# Patient Record
Sex: Female | Born: 1965 | Race: White | Hispanic: No | Marital: Married | Smoking: Never smoker
Health system: Southern US, Academic
[De-identification: ages and names within clinical notes are randomized; demographics above are authoritative.]

## PROBLEM LIST (undated history)

## (undated) DIAGNOSIS — M154 Erosive (osteo)arthritis: Secondary | ICD-10-CM

## (undated) DIAGNOSIS — F32A Depression, unspecified: Secondary | ICD-10-CM

## (undated) DIAGNOSIS — I10 Essential (primary) hypertension: Secondary | ICD-10-CM

## (undated) DIAGNOSIS — K219 Gastro-esophageal reflux disease without esophagitis: Secondary | ICD-10-CM

## (undated) DIAGNOSIS — J449 Chronic obstructive pulmonary disease, unspecified: Secondary | ICD-10-CM

## (undated) DIAGNOSIS — E039 Hypothyroidism, unspecified: Secondary | ICD-10-CM

## (undated) HISTORY — DX: Essential (primary) hypertension: I10

## (undated) HISTORY — DX: Chronic obstructive pulmonary disease, unspecified (CMS HCC): J44.9

## (undated) HISTORY — DX: Depression, unspecified: F32.A

## (undated) HISTORY — DX: Hypothyroidism, unspecified: E03.9

## (undated) HISTORY — PX: HX HYSTERECTOMY: SHX81

## (undated) HISTORY — PX: HX APPENDECTOMY: SHX54

## (undated) HISTORY — DX: Erosive (osteo)arthritis: M15.4

## (undated) HISTORY — DX: Gastro-esophageal reflux disease without esophagitis: K21.9

---

## 2019-12-08 IMAGING — MR MRI THORACIC SPINE WITHOUT CONTRAST
4 of 5 series · 26 of 48 positions shown · IV contrast (gadolinium)
Comparison: None.

﻿EXAM:  MRI THORACIC SPINE WITHOUT CONTRAST
INDICATION: Chronic mid back pain.
TECHNIQUE: Multiplanar, multisequential MRI of the thoracic spine was performed without gadolinium contrast.

[Series 5: T2 · sagittal · 4.0mm · 0.78mm/px · 11 of 13 slices shown (1 of 2)]
[im 1/13]
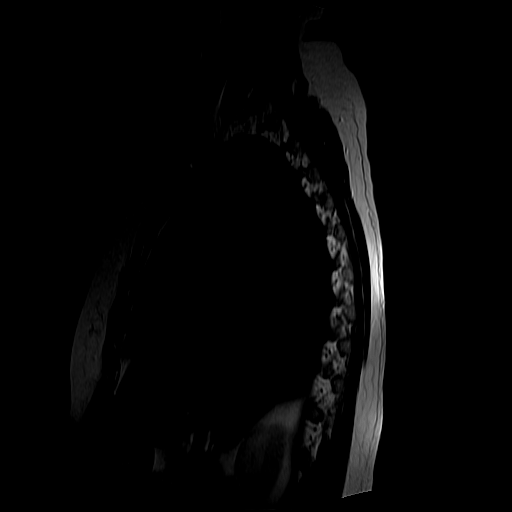
[im 2/13]
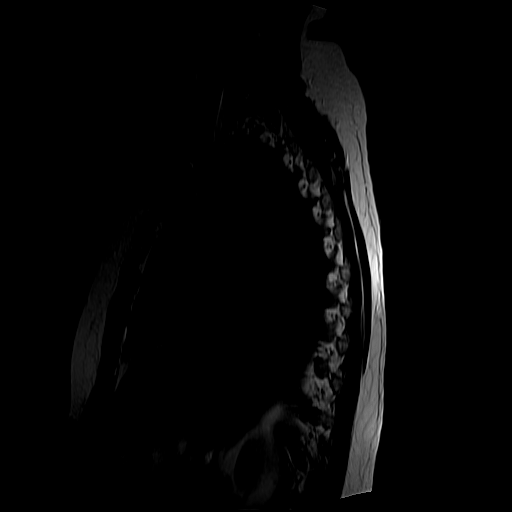
[im 3/13]
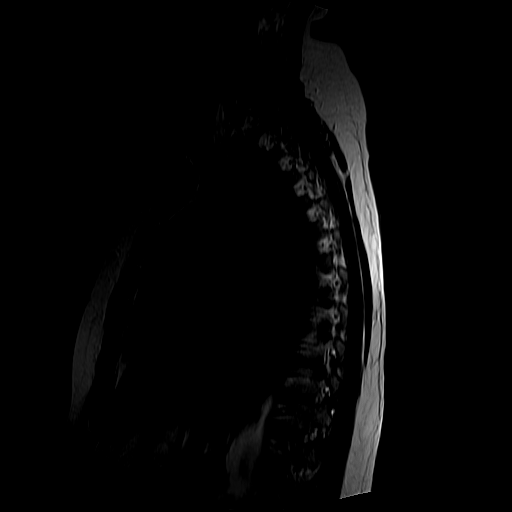
[im 4/13]
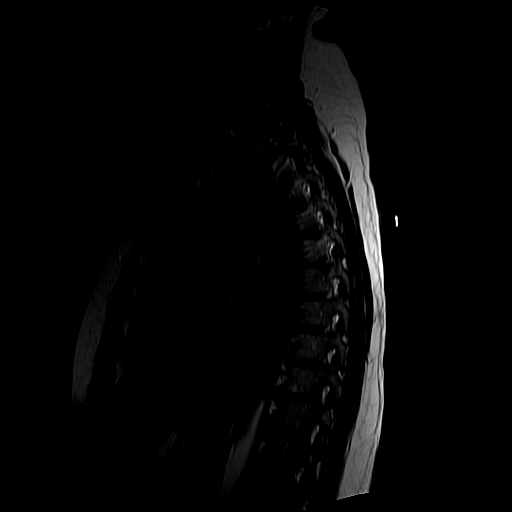
[im 5/13]
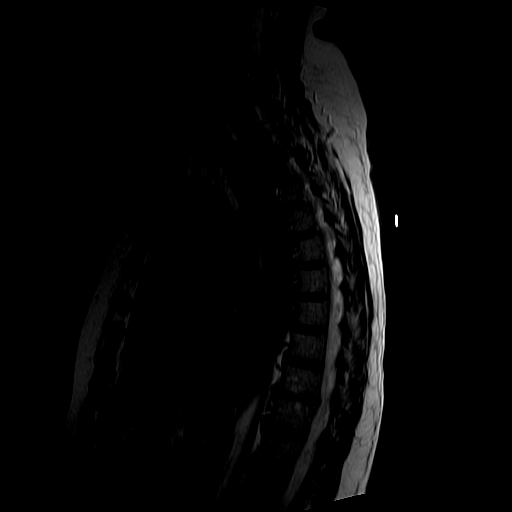
[im 7/13]
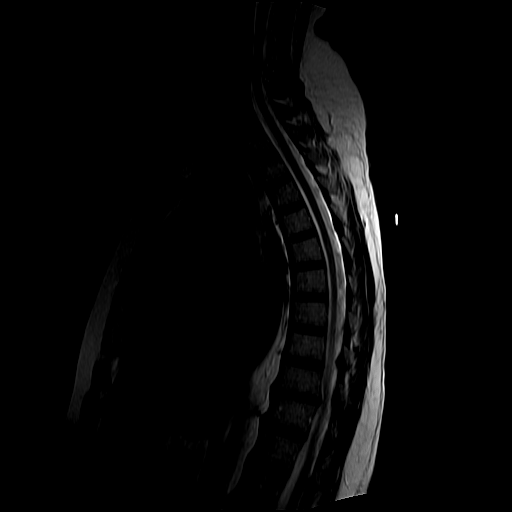
[im 8/13]
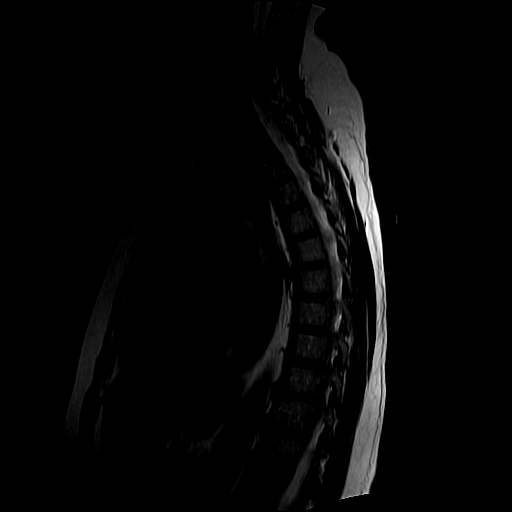
[im 9/13]
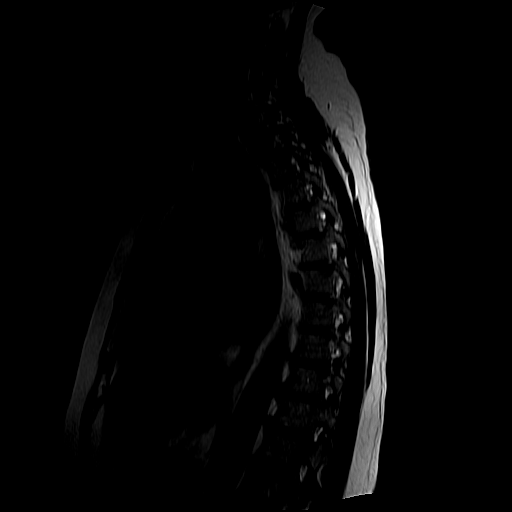
[im 10/13]
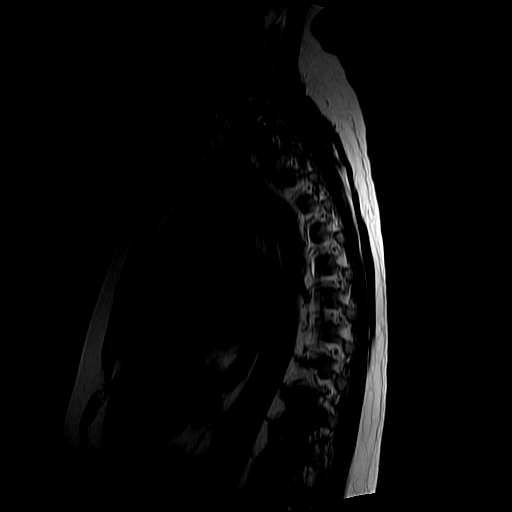
[im 11/13]
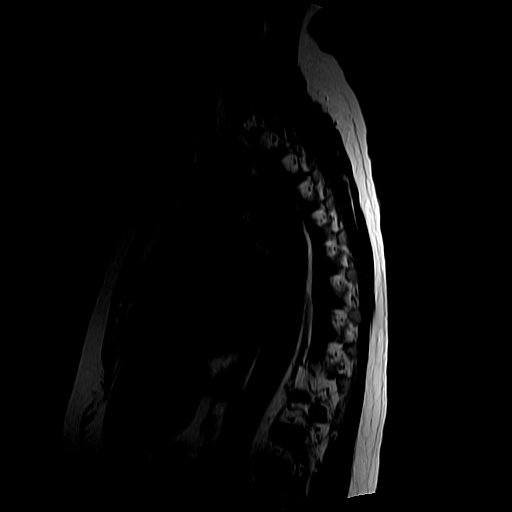
[im 13/13]
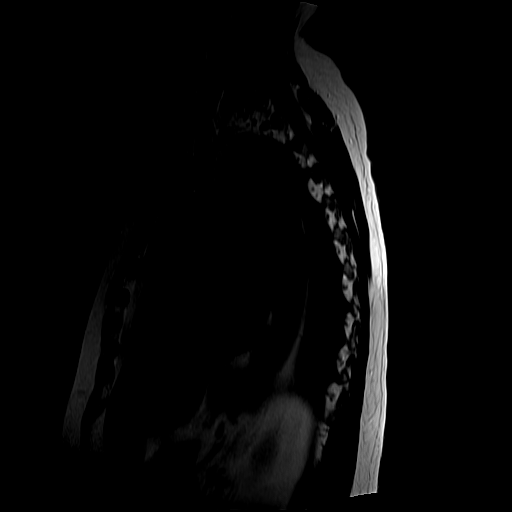

[Series 6: T1 · sagittal · 4.0mm · 0.78mm/px · 3 of 13 slices shown (1 of 2)]
[im 2/13]
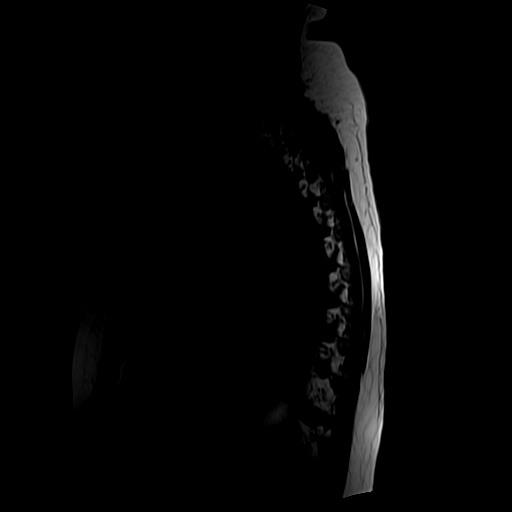
[im 7/13]
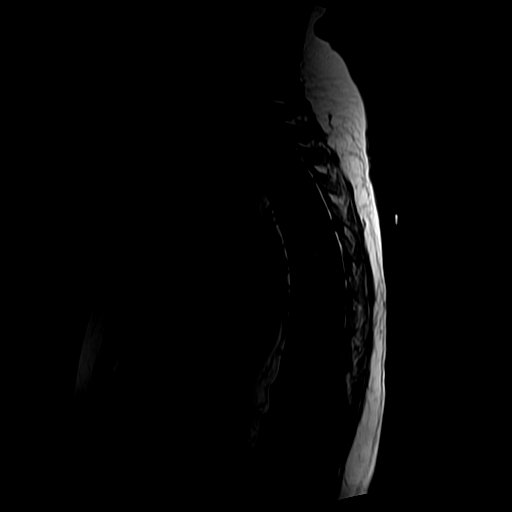
[im 11/13]
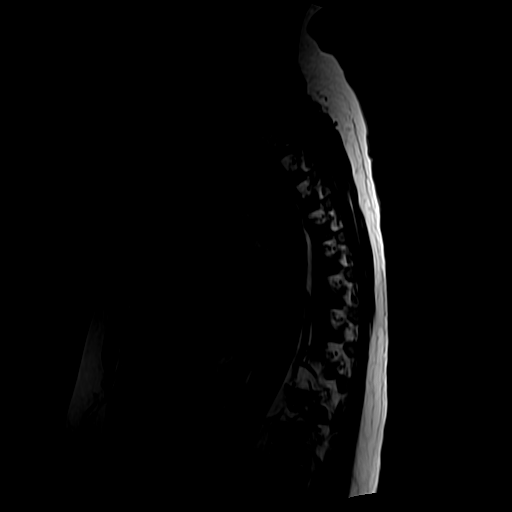

[Series 9: T2 · axial · 4.0mm · 0.62mm/px · z∈[-320,-107]mm · 9 of 12 slices shown (2 of 2)]
[im 1/12]
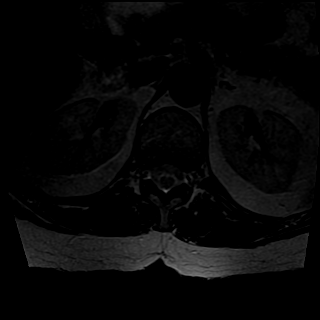
[im 2/12]
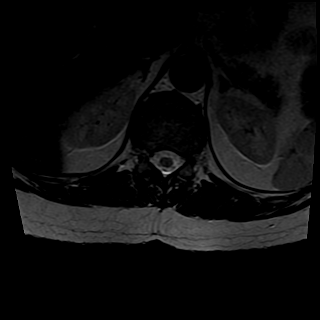
[im 3/12]
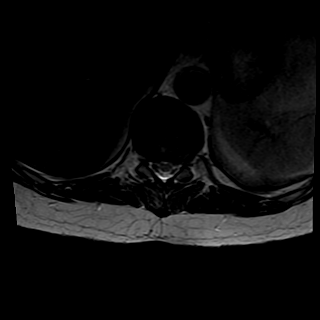
[im 5/12]
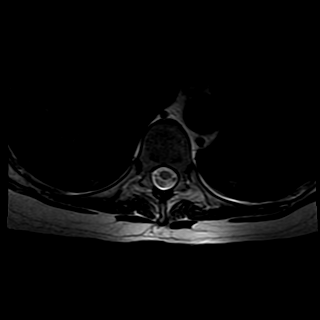
[im 6/12]
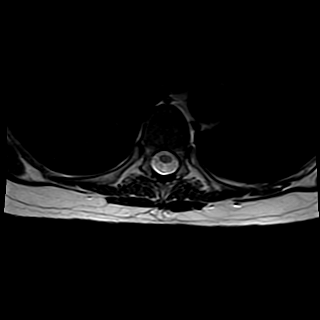
[im 7/12]
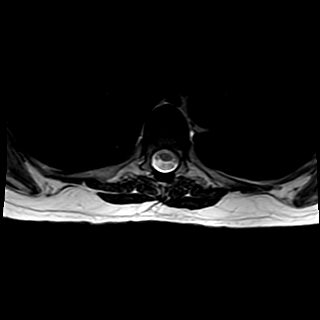
[im 9/12]
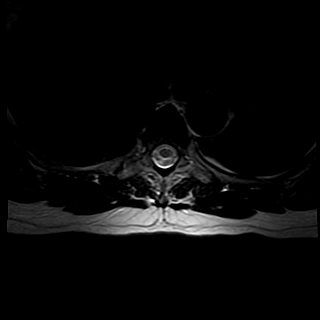
[im 10/12]
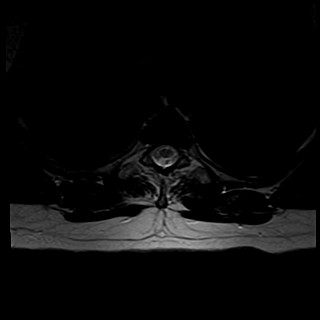
[im 12/12]
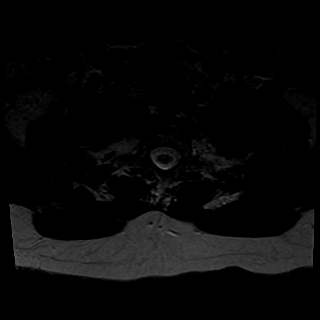

[Series 10: T1 · axial · 4.0mm · 0.62mm/px · z∈[-280,-140]mm · 3 of 12 slices shown (2 of 2)]
[im 2/12]
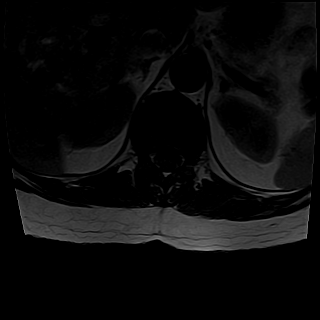
[im 6/12]
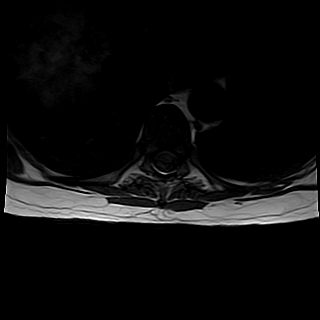
[im 10/12]
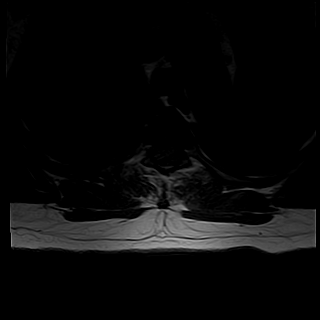

[26 of 48 positions shown; findings below may reference images not displayed]

FINDINGS: Vertebral bodies are normal in height, alignment and signal intensity.  There is no acute fracture or subluxation.  Visualized spinal cord is normal in signal intensity without evidence of compression at any level.  

There is a small broad-based central disc bulge at T10-T11, minimally indenting the ventral cord.  No significant disc herniation, spinal canal or neural foraminal stenosis is seen at any level.  

Paraspinal soft tissues are unremarkable.  There is no pleural effusion.
IMPRESSION: Degenerative changes at T10-T11 level as detailed above, otherwise unremarkable exam.

## 2019-12-08 IMAGING — MR MRI BRAIN WITHOUT CONTRAST
9 of 12 series · 31 of 48 positions shown · IV contrast (gadolinium)
Comparison: None.

﻿EXAM:  MRI BRAIN WITHOUT CONTRAST
INDICATION: Suspected demyelinating disease.
TECHNIQUE: Multiplanar, multisequential MRI of the brain was performed without gadolinium contrast.

[Series 5: DWI · axial · 5.0mm · 1.35mm/px · z∈[-44,+82]mm · 8 of 88 slices shown (1 of 3)]
[im 1/88]
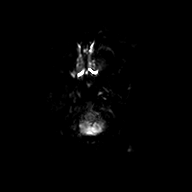
[im 10/88]
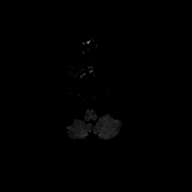
[im 30/88]
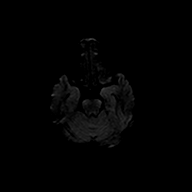
[im 39/88]
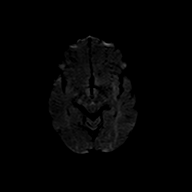
[im 49/88]
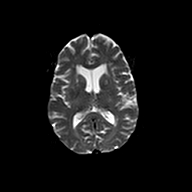
[im 59/88]
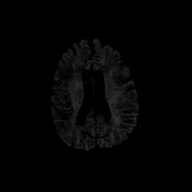
[im 78/88]
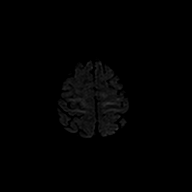
[im 88/88]
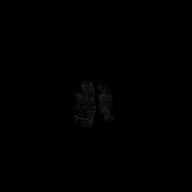

[Series 6: DWI · axial · 5.0mm · 1.35mm/px · z∈[-44,+82]mm · 2 of 22 slices shown (2 of 3)]
[im 1/22]
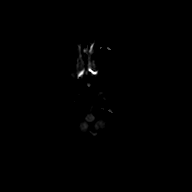
[im 22/22]
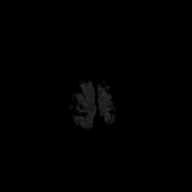

[Series 7: DWI · axial · 5.0mm · 1.35mm/px · z∈[-44,+82]mm · 2 of 20 slices shown (3 of 3)]
[im 1/20]
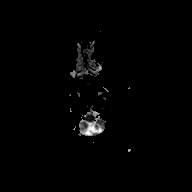
[im 20/20]
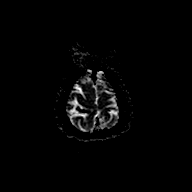

[Series 8: FLAIR · sagittal · 5.0mm · 0.75mm/px · 3 of 28 slices shown (1 of 2)]
[im 1/28]
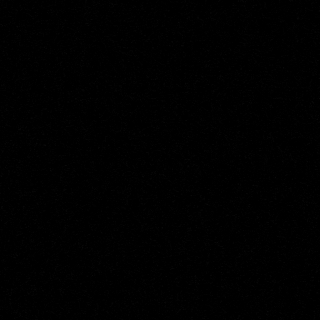
[im 14/28]
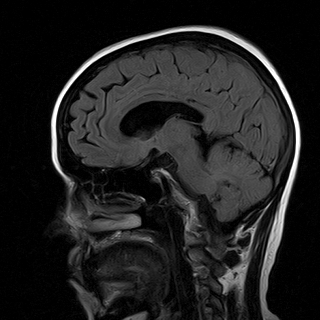
[im 28/28]
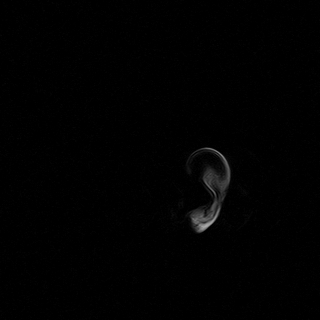

[Series 9: FLAIR · axial · 4.0mm · 0.43mm/px · z∈[-56,+102]mm · 4 of 36 slices shown (2 of 2)]
[im 1/36]
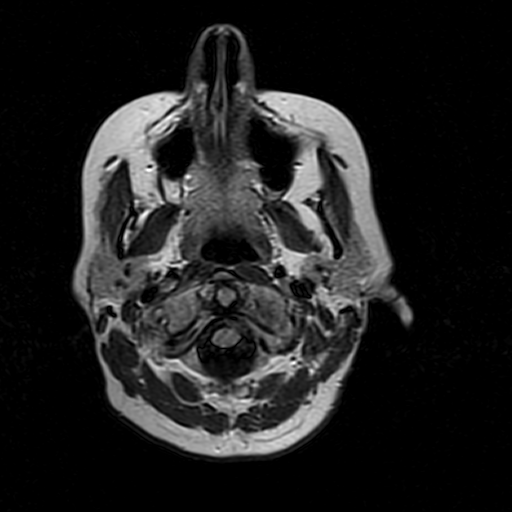
[im 12/36]
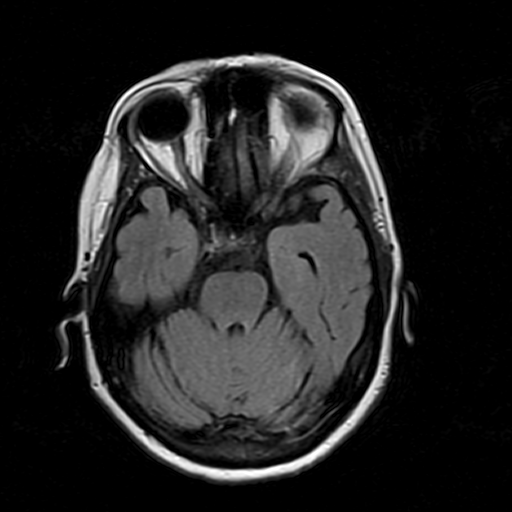
[im 24/36]
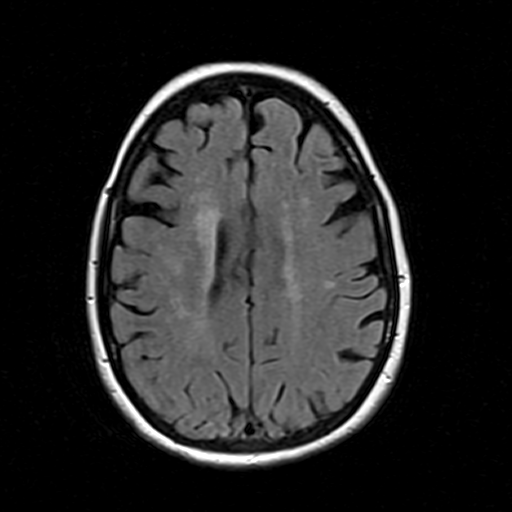
[im 36/36]
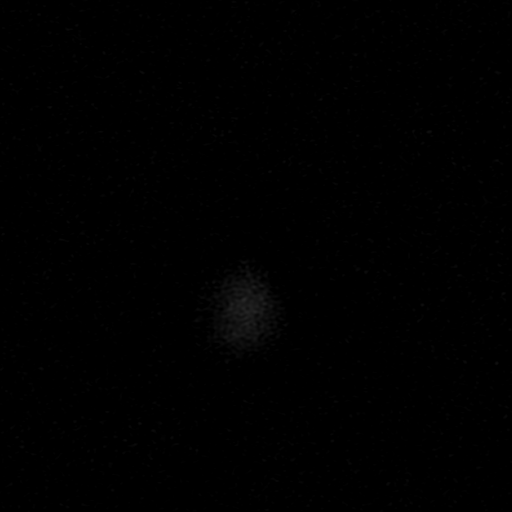

[Series 10: PD · axial · 4.0mm · 0.43mm/px · z∈[-56,+102]mm · 4 of 36 slices shown (1 of 2)]
[im 1/36]
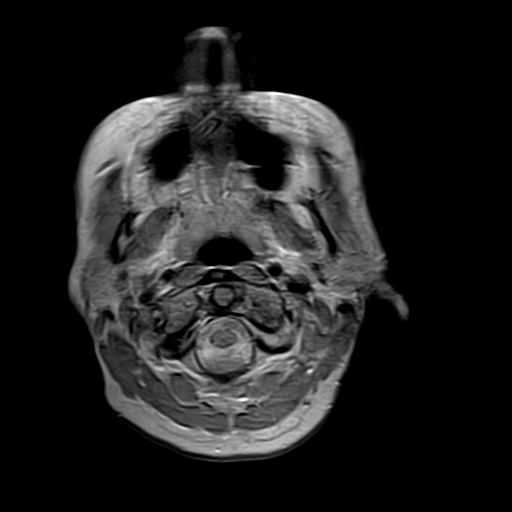
[im 12/36]
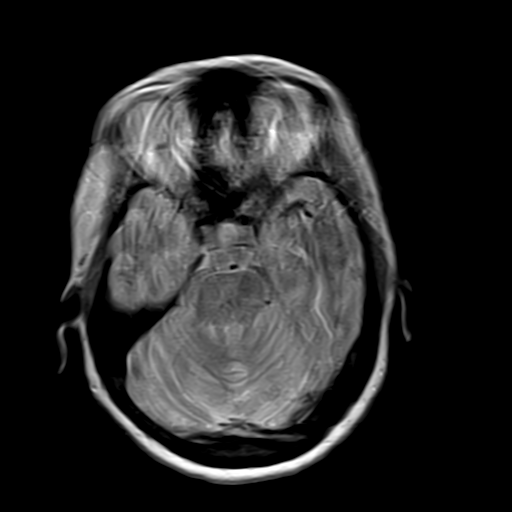
[im 24/36]
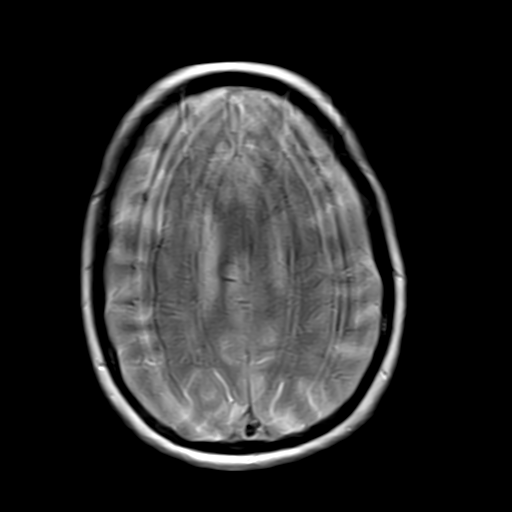
[im 36/36]
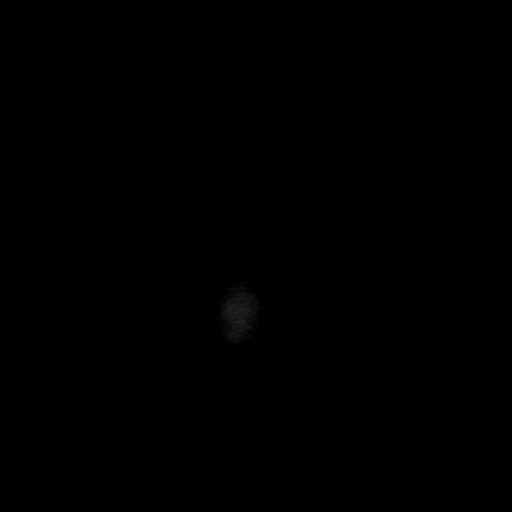

[Series 11: PD · axial · 4.0mm · 0.43mm/px · 1 of 36 slices shown (2 of 2)]
[im 1/36]
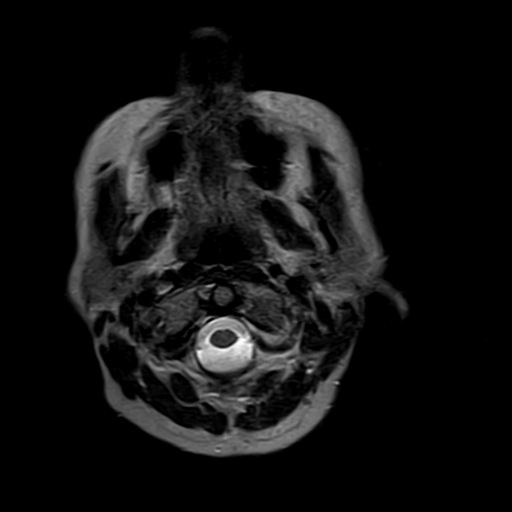

[Series 14: T1 · axial · 4.0mm · 0.43mm/px · z∈[-56,+102]mm · 4 of 36 slices shown]
[im 1/36]
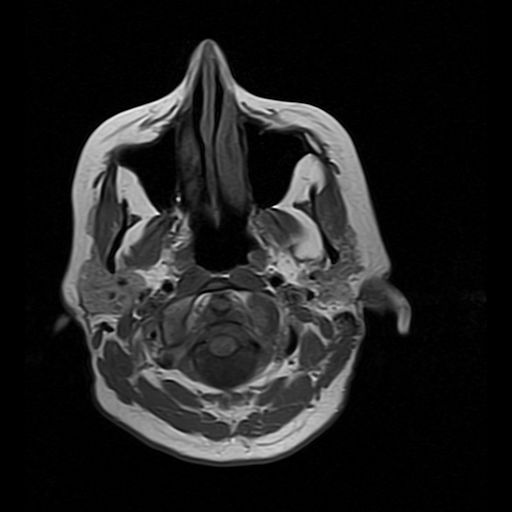
[im 12/36]
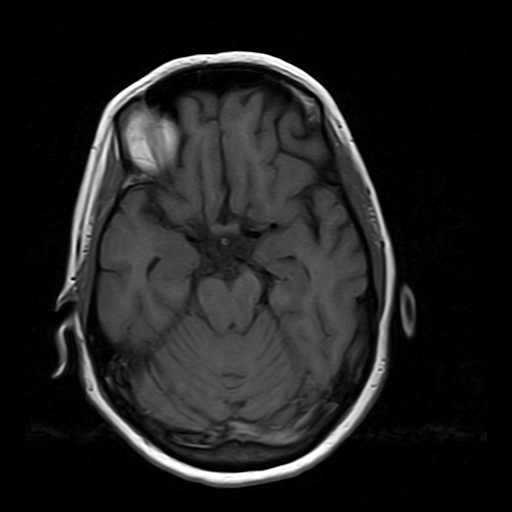
[im 24/36]
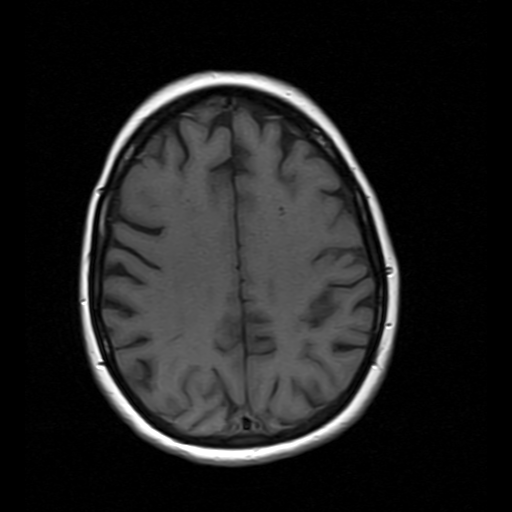
[im 36/36]
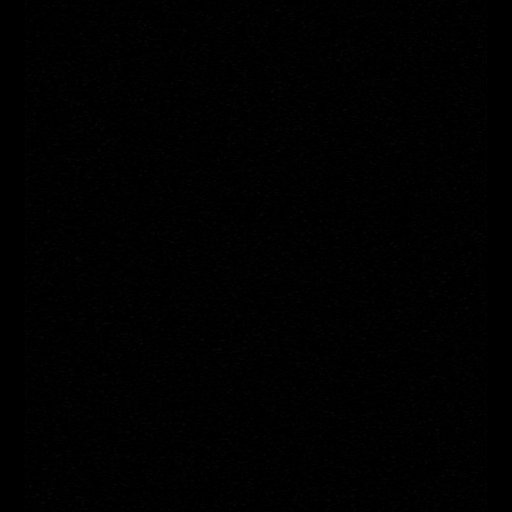

[Series 15: T2 · coronal · 5.0mm · 0.43mm/px · 3 of 28 slices shown]
[im 1/28]
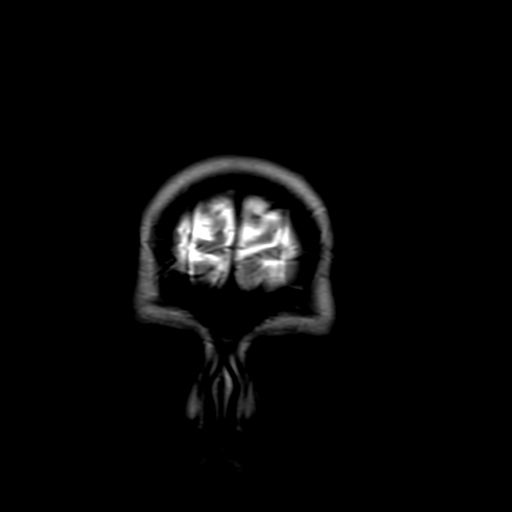
[im 14/28]
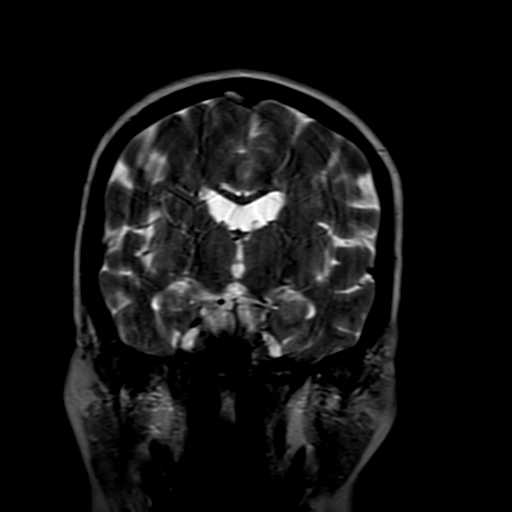
[im 28/28]
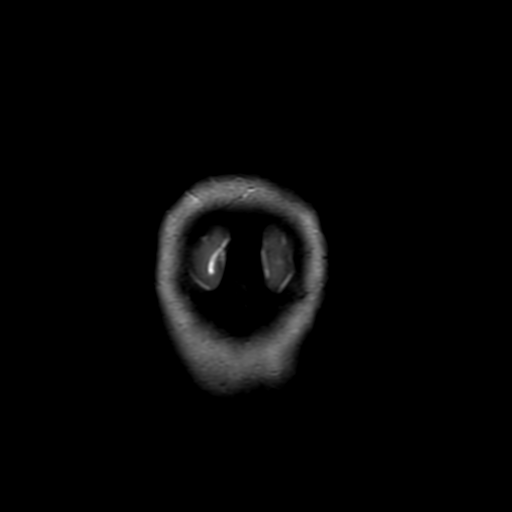

[31 of 48 positions shown; findings below may reference images not displayed]

FINDINGS: Examination is degraded due to excessive patient motion.  Ventricular and sulcal size is normal for the patient's age.  There are predominantly confluent foci of abnormal T2/FLAIR signal hyperintensity within the periventricular deep white matter of each cerebral hemisphere. There is no mass effect, midline shift or intracranial hemorrhage. There is no evidence of acute infarction or prior microhemorrhages. Skull base flow voids and basal cisterns are patent. Sagittal survey of midline structures is unremarkable. There are no extra-axial fluid collections. There is mucoperiosteal thickening within the dependent right maxillary sinus.  Mastoid air cells and orbital contents are unremarkable.
IMPRESSION: Confluent foci of abnormal T2/FLAIR signal hyperintensity within the periventricular deep white matter of each cerebral hemisphere.  Differential considerations would include demyelinating disease versus chronic ischemic changes.  Correlation with CSF analysis is recommended.

## 2021-03-05 IMAGING — MR MRI BRAIN WITHOUT AND WITH CONTRAST
11 of 16 series · 32 of 48 positions shown · IV contrast (Gadavist)
Comparison: Brain MRI dated 12/08/2019.

﻿EXAM:  MRI BRAIN WITHOUT AND WITH CONTRAST
INDICATION: History of multiple sclerosis, follow-up exam.
TECHNIQUE: Multiplanar multisequential MRI of the brain was performed without and with 5 mL of Gadavist.

[Series 5: DWI · axial · 5.0mm · 1.35mm/px · z∈[-41,+85]mm · 7 of 88 slices shown (1 of 3)]
[im 1/88]
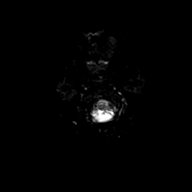
[im 15/88]
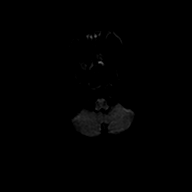
[im 30/88]
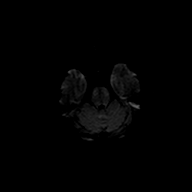
[im 44/88]
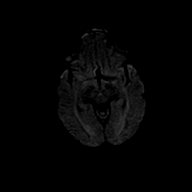
[im 59/88]
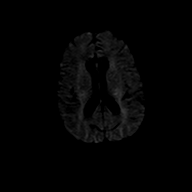
[im 73/88]
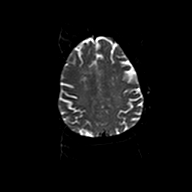
[im 88/88]
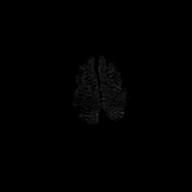

[Series 6: DWI · axial · 5.0mm · 1.35mm/px · z∈[-41,+85]mm · 2 of 22 slices shown (2 of 3)]
[im 1/22]
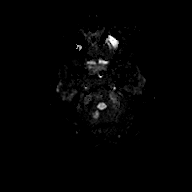
[im 22/22]
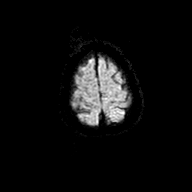

[Series 7: DWI · axial · 5.0mm · 1.35mm/px · z∈[-41,+85]mm · 2 of 22 slices shown (3 of 3)]
[im 1/22]
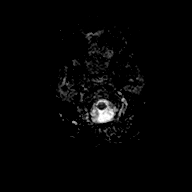
[im 22/22]
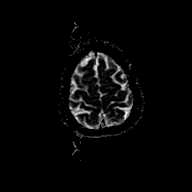

[Series 8: FLAIR · sagittal · 5.0mm · 0.75mm/px · 3 of 32 slices shown (1 of 2)]
[im 1/32]
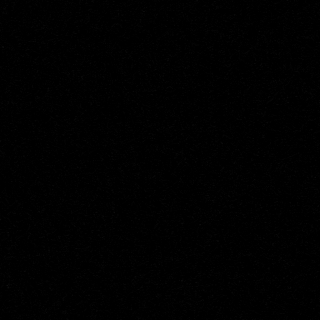
[im 16/32]
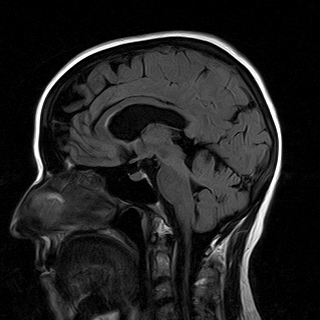
[im 32/32]
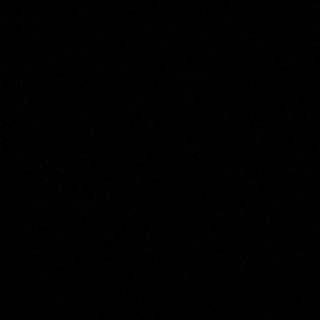

[Series 12: T1 · axial · 4.0mm · 0.43mm/px · z∈[-47,+107]mm · 3 of 36 slices shown]
[im 1/36]
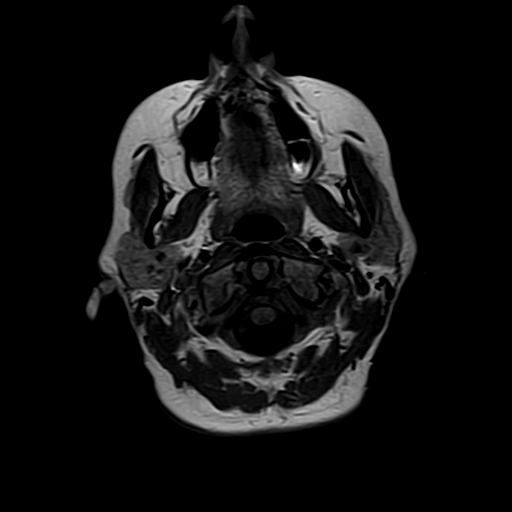
[im 18/36]
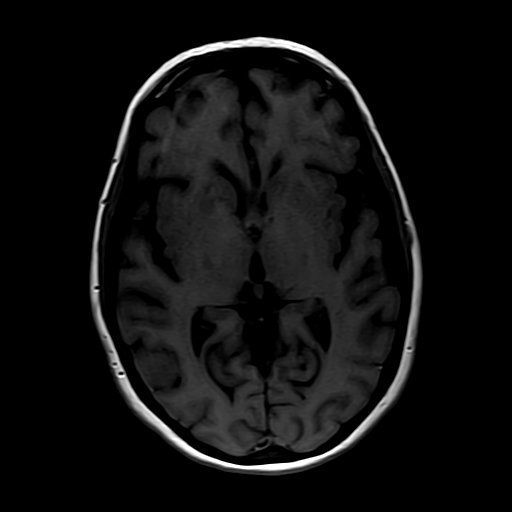
[im 36/36]
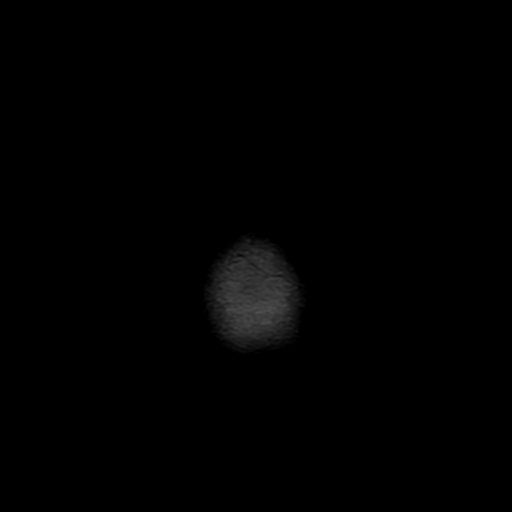

[Series 13: T2 · coronal · 5.0mm · 0.43mm/px · 2 of 28 slices shown]
[im 1/28]
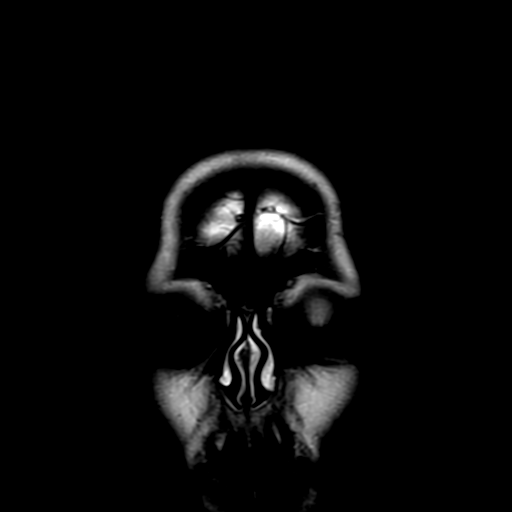
[im 28/28]
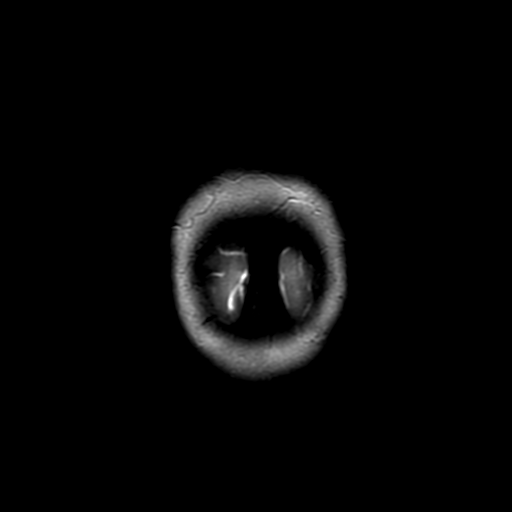

[Series 14: T1 fat-sat · coronal · 5.0mm · 0.57mm/px · 2 of 28 slices shown (1 of 2)]
[im 1/28]
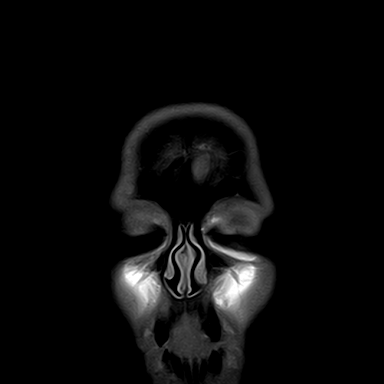
[im 28/28]
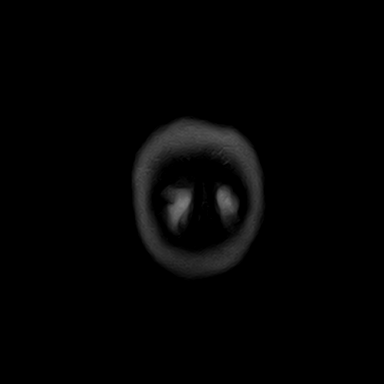

[Series 15: T1 fat-sat · axial · 4.0mm · 0.43mm/px · z∈[-47,+107]mm · 3 of 36 slices shown (2 of 2)]
[im 1/36]
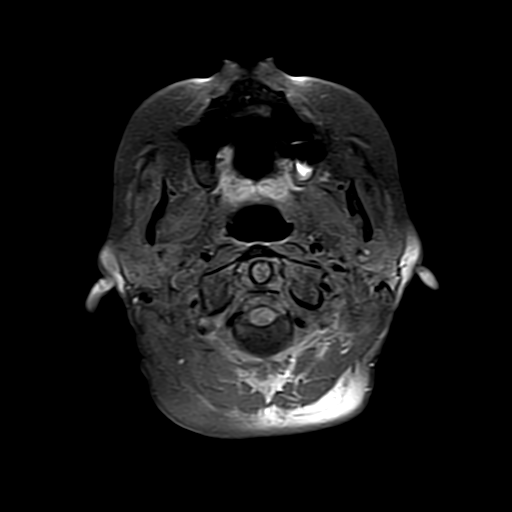
[im 18/36]
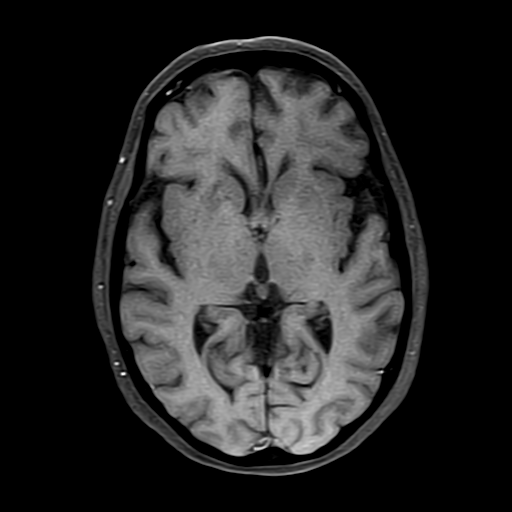
[im 36/36]
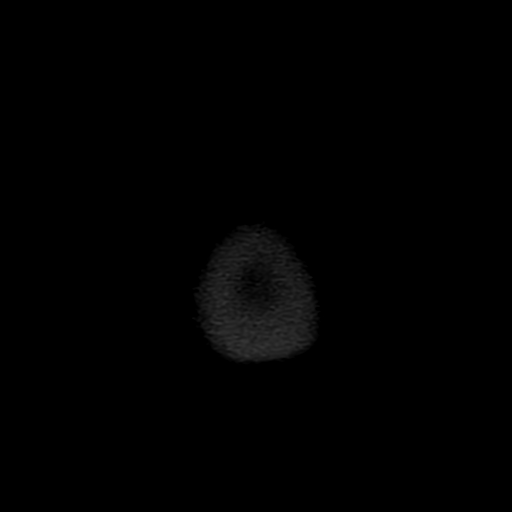

[Series 16: FLAIR · axial · 4.0mm · 0.43mm/px · z∈[-47,+107]mm · 3 of 36 slices shown (2 of 2)]
[im 1/36]
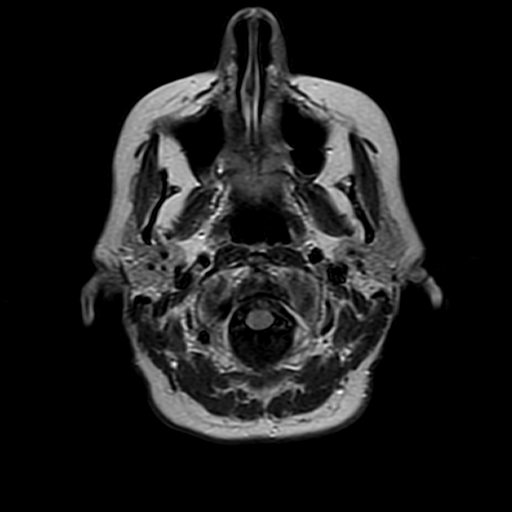
[im 18/36]
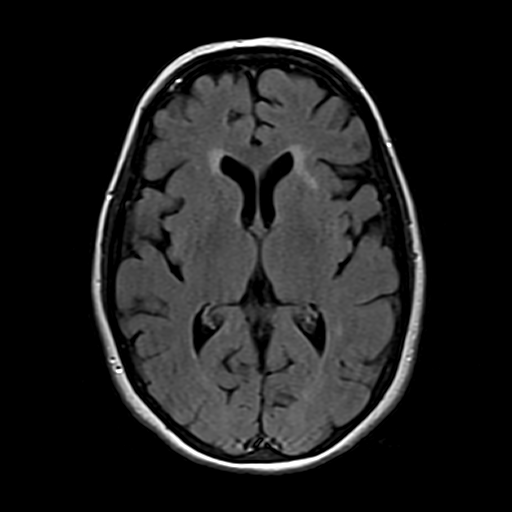
[im 36/36]
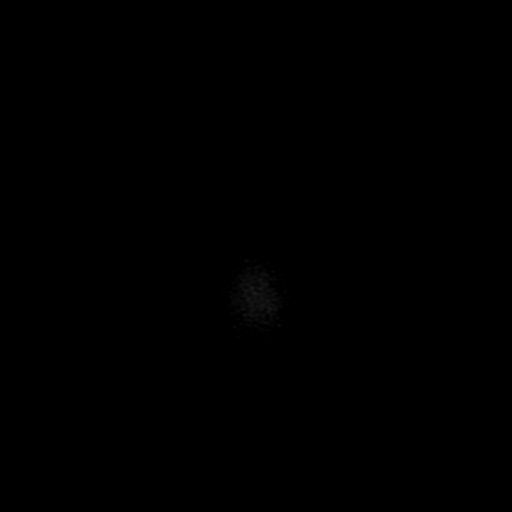

[Series 17: T1 fat-sat post-contrast · coronal · 5.0mm · 0.57mm/px · 2 of 28 slices shown]
[im 1/28]
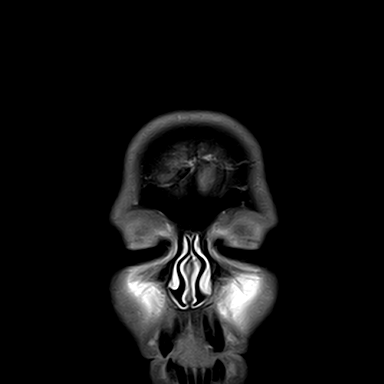
[im 28/28]
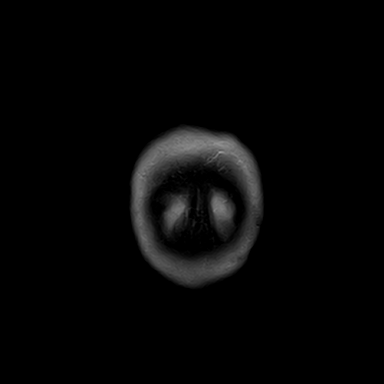

[Series 19: T1 post-contrast · axial · 4.0mm · 0.43mm/px · z∈[-47,+107]mm · 3 of 36 slices shown]
[im 1/36]
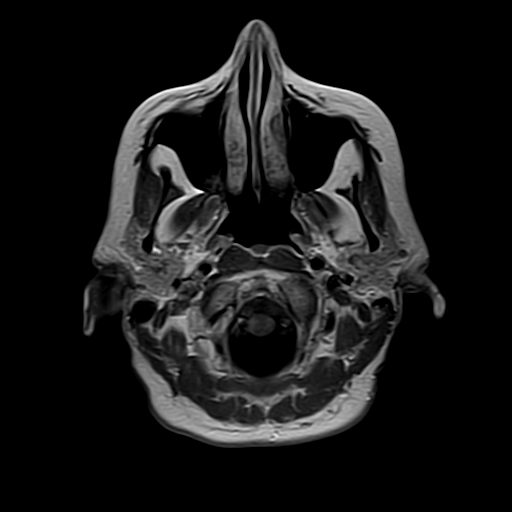
[im 18/36]
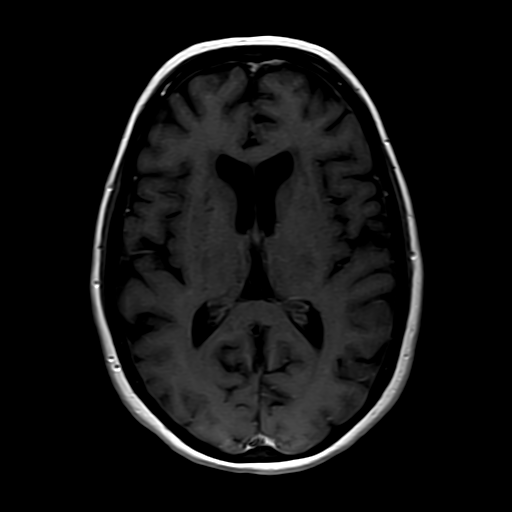
[im 36/36]
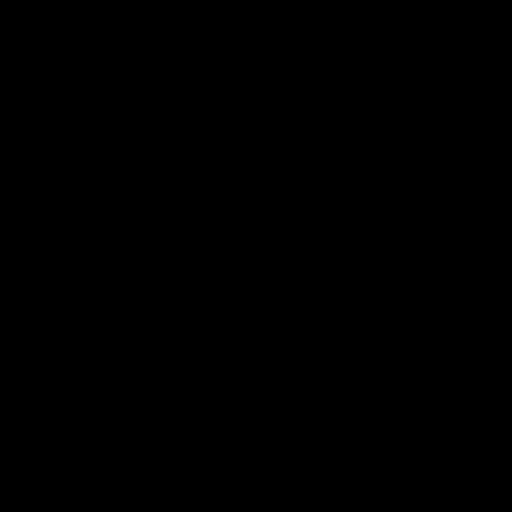

[32 of 48 positions shown; findings below may reference images not displayed]

FINDINGS: Ventricular and sulcal size is normal for the patient's age. Predominantly confluent foci of abnormal T2/FLAIR signal hyperintensity within the periventricular deep white matter of each cerebral hemisphere are again seen. There is no mass effect, midline shift or intracranial hemorrhage. There is no evidence of acute infarction or prior microhemorrhages. Skull base flow voids and basal cisterns are patent. Sagittal survey of midline structures is unremarkable. Following intravenous contrast administration, there is no abnormal parenchymal or leptomeningeal enhancement. There are no extra-axial fluid collections. Visualized paranasal sinuses, mastoid air cells and orbital contents are unremarkable.
IMPRESSION: 1. Stable disease. No evidence of active demyelination. 

 2. No abnormal parenchymal or leptomeningeal enhancement.

## 2022-05-04 ENCOUNTER — Ambulatory Visit (HOSPITAL_COMMUNITY): Payer: Self-pay | Admitting: PAIN MANAGEMENT

## 2022-06-29 ENCOUNTER — Encounter (HOSPITAL_BASED_OUTPATIENT_CLINIC_OR_DEPARTMENT_OTHER): Payer: Self-pay | Admitting: PAIN MANAGEMENT

## 2022-07-08 ENCOUNTER — Telehealth (HOSPITAL_BASED_OUTPATIENT_CLINIC_OR_DEPARTMENT_OTHER): Payer: Self-pay | Admitting: PAIN MANAGEMENT

## 2022-07-08 NOTE — Telephone Encounter (Signed)
Patient called and I was able to pick up she is scheduled for a procedure this Friday and wanted to go over it why we are doing it and what she could expect I was able to explain all this to her other than the cost she wanted to know her out of pocket expense I did give her some numbers for billing to ask as well

## 2022-07-09 ENCOUNTER — Other Ambulatory Visit: Payer: Self-pay

## 2022-07-10 ENCOUNTER — Encounter (HOSPITAL_BASED_OUTPATIENT_CLINIC_OR_DEPARTMENT_OTHER)
Admission: RE | Disposition: A | Discharge status: Home / Self Care | Payer: Self-pay | Source: Ambulatory Visit | Attending: PAIN MANAGEMENT

## 2022-07-10 ENCOUNTER — Other Ambulatory Visit (HOSPITAL_COMMUNITY): Payer: BC Managed Care – PPO

## 2022-07-10 ENCOUNTER — Other Ambulatory Visit: Payer: Self-pay

## 2022-07-10 ENCOUNTER — Inpatient Hospital Stay
Admission: RE | Admit: 2022-07-10 | Discharge: 2022-07-10 | Disposition: A | Discharge status: Home / Self Care | Payer: BC Managed Care – PPO | Source: Ambulatory Visit | Attending: PAIN MANAGEMENT | Admitting: PAIN MANAGEMENT

## 2022-07-10 ENCOUNTER — Encounter (HOSPITAL_BASED_OUTPATIENT_CLINIC_OR_DEPARTMENT_OTHER): Payer: Self-pay | Admitting: PAIN MANAGEMENT

## 2022-07-10 DIAGNOSIS — M47814 Spondylosis without myelopathy or radiculopathy, thoracic region: Secondary | ICD-10-CM | POA: Insufficient documentation

## 2022-07-10 DIAGNOSIS — G894 Chronic pain syndrome: Secondary | ICD-10-CM | POA: Insufficient documentation

## 2022-07-10 SURGERY — PAIN SERVICE INJECTION FACET CERVICAL/THORACIC
Anesthesia: Local (Nurse-Monitored) | Site: Back | Laterality: Bilateral | Wound class: Clean Wound: Uninfected operative wounds in which no inflammation occurred

## 2022-07-10 MED ORDER — BUPIVACAINE (PF) 0.5 % (5 MG/ML) INJECTION SOLUTION
Freq: Once | INTRAMUSCULAR | Status: DC | PRN
Start: 2022-07-10 — End: 2022-07-10
  Administered 2022-07-10: 6 mL via INTRAMUSCULAR

## 2022-07-10 MED ORDER — LIDOCAINE HCL 10 MG/ML (1 %) INJECTION SOLUTION
Freq: Once | INTRAMUSCULAR | Status: DC | PRN
Start: 2022-07-10 — End: 2022-07-10
  Administered 2022-07-10: 20 mL via INTRAMUSCULAR

## 2022-07-10 NOTE — Nurses Notes (Signed)
Ice pack applied to injection sites. VSS. Pt denies any questions or concerns. Pt ambulated out with staff.

## 2022-07-10 NOTE — Discharge Instructions (Signed)
-  Apply ice to the procedure site for 30 minute intervals.   -Do this on at least 3 occasions: do not exceed 30 minutes at a time.  -Continue ice and ibuprofen for up to 3 days if site pain continues.   -You may remove band-aids after one hour.   -Normal activity may resume the following day as able.  -You may take medications prescribed by the physicians as needed for pain.  -Do not drive for 12 hours following the procedure.  -No baths for 24 hours following the procedure, but can shower.  -You may experience some numbness or weakness for the next 12 hours. Please be careful walking and standing.  -Notify your physician if redness or swelling persists, if fever develops, if the wound has drainage, or a rash develops.   -If you have and further questions please ask the nurse or a staff member for assistance prior to check-out or call the [facility] at [phone].

## 2022-07-10 NOTE — H&P (Signed)
H&P UPDATE FORM                                                                                  Gara, Wahls, 57 y.o. female  Date of Admission:  07/10/2022  Date of Birth:  10/09/1965    07/10/2022    STOP: IF H&P IS GREATER THAN 30 DAYS FROM SURGICAL DAY COMPLETE NEW H&P IS REQUIRED.     H & P updated the day of the procedure.  1.  H&P completed within 30 days of surgical procedure  and has been reviewed within 24 hours of the surgery, the patient has been examined, and no change has occured in the patients condition since the H&P was completed.       Change in medications: No     No LMP recorded.      Comments:     2.  Patient continues to be appropriate candidate for planned surgical procedure. YES    Wendie Chess, MD

## 2022-07-10 NOTE — OR Surgeon (Signed)
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    Anesthesia Type: Local (Nurse-Monitored)     Estimated Blood Loss: Minimal    Implants: * No implants in log * * No implants in log *     Specimens Removed:   * No orders in the log *     Complications:  None    Condition:  Stable    Disposition:   PACU - hemodynamically stable.    Indications:  Patient is a 57 y.o. female who presents for spondylosis .    Description of Procedure/Operation:  PROCEDURE: DIAGNOSTIC BILATERAL T10, T11, T12 LUMBAR MEDIAL BRANCH NERVE BLOCK (To treat T11-12 & T12-L1 Facet Levels) under Fluoroscopy  MEDICAL NECESSITY: The patient has tried or failed multiple conservative treatment measures in the past or those measures were not appropriate. Patient's QOL and ability to complete ADL's continued to worsen, thus leading to worsening pain and decreased function. Based on the history and physical exam from previous treatments today's procedure is medically indicated and necessary. All patient initiated questions were answered. Risks discussed and patient wished to proceed. Informed consent was then obtained.  DESCRIPTION OF PROCEDURE: The patient was brought to the procedure suite. The patient was positioned prone on the fluoroscopy table. Continuous hemodynamic monitoring was initiated including blood pressure and pulse oximetry. The area of  the lumbar spine was prepped and draped in the usual sterile fashion. Using AP, lateral and oblique fluoroscopic guidance, and after appropriate topicalization with a total of 20 mL of 1% lidocaine, a 3.5 inch 22G spinal needle was introduced at each level until tip of the needle was placed in the medial portion of the lamina for facet joint and medial branch block. Then 1 mL of 0.5% Marcaine was injected after negative serial aspiration at each level bilaterally. The needles were removed and bleeding was nil. Dressings were applied. The patient tolerated the procedure well and there were no complications.  Patient was observed for the appropriate post operative period in recovery area and discharged home in stable condition. Post-procedure instructions were given.  Wendie Chess, MD

## 2022-07-24 ENCOUNTER — Ambulatory Visit (HOSPITAL_BASED_OUTPATIENT_CLINIC_OR_DEPARTMENT_OTHER): Payer: Self-pay | Admitting: NURSE PRACTITIONER

## 2022-07-28 ENCOUNTER — Ambulatory Visit: Payer: BC Managed Care – PPO | Attending: NURSE PRACTITIONER | Admitting: NURSE PRACTITIONER

## 2022-07-28 ENCOUNTER — Encounter (HOSPITAL_BASED_OUTPATIENT_CLINIC_OR_DEPARTMENT_OTHER): Payer: Self-pay | Admitting: NURSE PRACTITIONER

## 2022-07-28 ENCOUNTER — Other Ambulatory Visit: Payer: Self-pay

## 2022-07-28 VITALS — BP 137/78 | HR 62 | Ht 69.0 in | Wt 220.0 lb

## 2022-07-28 DIAGNOSIS — M47814 Spondylosis without myelopathy or radiculopathy, thoracic region: Secondary | ICD-10-CM | POA: Insufficient documentation

## 2022-07-28 DIAGNOSIS — G894 Chronic pain syndrome: Secondary | ICD-10-CM | POA: Insufficient documentation

## 2022-07-28 NOTE — Nursing Note (Signed)
Dr Ames Coupe pt here for fu after  BILATERAL T10, T11, T12 MBNB   Reports mid back pain  Percentage ofd relief to be assessed per provider  Current pain level: 4/10  Best: 4/10  Worst 10/10  Nature of pain: ache  Constant standing, bending sitting increases pain          07/28/2022   Conservative Therapies   Type of Injury None   Has the patient participated in Physical Therapy? No   Has the patient participated in Chiropractic Manipulation? No   Has the patient participated in Home Exercise? No   Previous Medications NSAIDS;Muscle relaxants     ,       07/28/2022    12:00 PM   Opioid Risk Assessment   Family History Illegal Drug Abuse 0   Family History of Prescription Drug Abuse 0   Personal History of Illegal Drug Use 0   Personal History Prescription Drug Abuse 0   Age 57-64 years old 0   Depression Diagnosis 0   Total Score 0        Previous XRays:  FLUORO PAIN INJECTION IN OR  *Procedure not read by radiology.    *Please Refer to Procedure Note for result.      Previous CTs:  No results found for this or any previous visit.      Previous MRIs  No results found for this or any previous visit.

## 2022-07-28 NOTE — H&P (Signed)
PAIN MANAGEMENT, [facility]  [address]  [place] [address]  Operated by [facility]  History and Physical    Name: Frances Moreno MRN:  [id number]   Date: 07/28/2022 DOB:  [birth year] (57 y.o.)               Provider: Kathleen Argue, FNP-BC  PCP: Leonides Cave, DO  Referring Provider: Leonides Cave     Reason for visit: Thoracic Spine Pain (Fu after injection)      Objective:  Nursing Notes:   Sabino Gasser, RN  07/28/22 1242  Signed  Dr Ames Coupe pt here for fu after  BILATERAL T10, T11, T12 MBNB   Reports mid back pain  Percentage ofd relief to be assessed per provider  Current pain level: 4/10  Best: 4/10  Worst 10/10  Nature of pain: ache  Constant standing, bending sitting increases pain          07/28/2022   Conservative Therapies   Type of Injury None   Has the patient participated in Physical Therapy? No   Has the patient participated in Chiropractic Manipulation? No   Has the patient participated in Home Exercise? No   Previous Medications NSAIDS;Muscle relaxants     ,       07/28/2022    12:00 PM   Opioid Risk Assessment   Family History Illegal Drug Abuse 0   Family History of Prescription Drug Abuse 0   Personal History of Illegal Drug Use 0   Personal History Prescription Drug Abuse 0   Age 55-34 years old 0   Depression Diagnosis 0   Total Score 0        Previous XRays:  FLUORO PAIN INJECTION IN OR  *Procedure not read by radiology.    *Please Refer to Procedure Note for result.      Previous CTs:  No results found for this or any previous visit.      Previous MRIs  No results found for this or any previous visit.        HPI:  Patient here to follow up after having bilateral thoracic MBNB. Patient states she received 100% relief for about 12 hours. Patient states after 12 hours she could feel the pain slowly start returning. Patient states she is ready to have the second set of diagnostic injections. Hopefully to then proceed to the thoracic RFA for  a more long term relief.     The history is provided by the patient.   Back Pain  This is a chronic problem. The current episode started more than 1 year ago. The problem occurs daily. The problem has been gradually worsening since onset. The pain is present in the thoracic spine. The quality of the pain is described as aching, burning, shooting and stabbing. The pain is moderate. The symptoms are aggravated by bending, lying down, position, standing and twisting. She has tried analgesics, bed rest, home exercises, heat, ice, muscle relaxant, walking and NSAIDs for the symptoms. The treatment provided moderate relief.       Past Medical History:  Past Medical History:   Diagnosis Date    COPD (chronic obstructive pulmonary disease) (CMS HCC)     Depression     Erosive osteoarthritis     Essential hypertension     GERD (gastroesophageal reflux disease)     Hypothyroidism      Past Surgical History:   Procedure Laterality Date    HX APPENDECTOMY  HX HYSTERECTOMY        Allergies   Allergen Reactions    Sulfa (Sulfonamides) Rash    Amoxicillin     Penicillins Nausea/ Vomiting     Current Outpatient Medications   Medication Sig    cyclobenzaprine (FLEXERIL) 5 mg Oral Tablet Take 1 Tablet (5 mg total) by mouth Every 8 hours as needed for Muscle spasms    hydroxychloroquine (PLAQUENIL) 200 mg Oral Tablet Take 1 Tablet (200 mg total) by mouth Twice daily    metoprolol succinate (TOPROL-XL) 100 mg Oral Tablet Sustained Release 24 hr Take 1 Tablet (100 mg total) by mouth Once a day    nabumetone (RELAFEN) 750 mg Oral Tablet Take 1 Tablet (750 mg total) by mouth Twice daily    Norgestimate-Ethinyl Estradiol (equiv to: ORTHO-CYCLEN) 0.25-35 mg-mcg Oral Tablet Take 1 Tablet by mouth Once a day    pantoprazole (PROTONIX) 40 mg Oral Tablet, Delayed Release (E.C.) Take 1 Tablet (40 mg total) by mouth Once a day    rOPINIRole (REQUIP) 0.25 mg Oral Tablet Take 1 Tablet (0.25 mg total) by mouth Once a day     Family Medical  History:    None        Social History     Socioeconomic History    Marital status: Married   Tobacco Use    Smoking status: Never     Passive exposure: Never    Smokeless tobacco: Never   Substance and Sexual Activity    Alcohol use: Never    Drug use: Never   Social History Narrative    Conservative therapies    Tried and failed conservative therapies    - Work related injury: No    - Automobile injury: No    - Personal injury: No    - Physical therapy: No    Previous medications: NSAIDs, muscle relaxants and other (Nabumetone )    - Surgical evaluation: No       Physical Exam:  Vital Signs:  BP 137/78   Pulse 62   Ht 1.753 m (5\' 9" )   Wt 99.8 kg (220 lb)   BMI 32.49 kg/m         Physical Exam  Vitals and nursing note reviewed.   Musculoskeletal:      Thoracic back: Tenderness present. Decreased range of motion.         Assessment:    ICD-10-CM    1. 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 Chronic pain syndrome  G89.4            Plan:  No orders of the defined types were placed in this encounter.    Patient has tried and failed > 6 month of conservative treatment such as exercise, physical therapy and/or chiropratic care, NSAID;s and/or muscle relaxants.   Patient QOL and ability to complete ADL's continues to worsen, leading to worsening pain and decreased function  Treatment options were discussed with patient including benefits vs risk.  Patient denies any new neurological s/s changes including bladder or bowel dysfunction.   Patient will call for appointment if pain level changes for the worse.   I will discuss physical therapy, injection therapy, or medication changes if necessary.   Long term goal is to improve pain, function, and quality of life with minimally invasive procedures.   Will order patient her second diagnostic test injections. Will order bilateral thoracic MBNB at levels T10, T11, T12    Return after injections,  for In Person Visit.    Kathleen Argue, FNP-BC     Portions of this note may be  dictated using voice recognition software or a dictation service. Variances in spelling and vocabulary are possible and unintentional. Not all errors are caught/corrected. Please notify the [facility]in if any discrepancies are noted or if the meaning of any statement is not clear.

## 2022-10-02 DIAGNOSIS — M47814 Spondylosis without myelopathy or radiculopathy, thoracic region: Secondary | ICD-10-CM | POA: Insufficient documentation

## 2022-10-02 DIAGNOSIS — G894 Chronic pain syndrome: Secondary | ICD-10-CM | POA: Insufficient documentation

## 2022-10-16 ENCOUNTER — Ambulatory Visit (HOSPITAL_BASED_OUTPATIENT_CLINIC_OR_DEPARTMENT_OTHER): Payer: Self-pay | Admitting: NURSE PRACTITIONER

## 2022-10-30 DIAGNOSIS — M47814 Spondylosis without myelopathy or radiculopathy, thoracic region: Secondary | ICD-10-CM | POA: Insufficient documentation

## 2022-10-30 DIAGNOSIS — G894 Chronic pain syndrome: Secondary | ICD-10-CM | POA: Insufficient documentation

## 2022-11-13 ENCOUNTER — Inpatient Hospital Stay: Admission: RE | Admit: 2022-11-13 | Payer: BC Managed Care – PPO | Source: Ambulatory Visit | Admitting: PAIN MANAGEMENT

## 2022-11-13 DIAGNOSIS — M47814 Spondylosis without myelopathy or radiculopathy, thoracic region: Secondary | ICD-10-CM | POA: Insufficient documentation

## 2022-11-13 DIAGNOSIS — G894 Chronic pain syndrome: Secondary | ICD-10-CM | POA: Insufficient documentation

## 2022-11-13 SURGERY — PAIN SERVICE INJECTION FACET CERVICAL/THORACIC
Anesthesia: Local (Nurse-Monitored) | Laterality: Bilateral

## 2022-11-18 ENCOUNTER — Ambulatory Visit (HOSPITAL_BASED_OUTPATIENT_CLINIC_OR_DEPARTMENT_OTHER): Payer: Self-pay | Admitting: NURSE PRACTITIONER

## 2022-11-26 ENCOUNTER — Ambulatory Visit (HOSPITAL_BASED_OUTPATIENT_CLINIC_OR_DEPARTMENT_OTHER): Payer: Self-pay | Admitting: NURSE PRACTITIONER
